# Patient Record
Sex: Male | Born: 1995 | Hispanic: Yes | Marital: Single | Smoking: Current some day smoker
Health system: Southern US, Community
[De-identification: ages and names within clinical notes are randomized; demographics above are authoritative.]

## PROBLEM LIST (undated history)

## (undated) DIAGNOSIS — F32A Depression, unspecified: Secondary | ICD-10-CM

## (undated) DIAGNOSIS — F419 Anxiety disorder, unspecified: Secondary | ICD-10-CM

## (undated) DIAGNOSIS — F329 Major depressive disorder, single episode, unspecified: Secondary | ICD-10-CM

## (undated) HISTORY — PX: STRABISMUS SURGERY: SHX218

---
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
## 2017-05-21 ENCOUNTER — Encounter: Payer: Self-pay | Admitting: *Deleted

## 2017-05-21 ENCOUNTER — Emergency Department: Payer: BLUE CROSS/BLUE SHIELD

## 2017-05-21 ENCOUNTER — Observation Stay: Payer: BLUE CROSS/BLUE SHIELD | Admitting: Anesthesiology

## 2017-05-21 ENCOUNTER — Encounter
Admission: EM | Disposition: A | Discharge status: Home / Self Care | Payer: Self-pay | Source: Home / Self Care | Attending: General Surgery

## 2017-05-21 ENCOUNTER — Inpatient Hospital Stay
Admission: EM | Admit: 2017-05-21 | Discharge: 2017-05-23 | DRG: 341 | Disposition: A | Discharge status: Home / Self Care | Payer: BLUE CROSS/BLUE SHIELD | Attending: General Surgery | Admitting: General Surgery

## 2017-05-21 ENCOUNTER — Other Ambulatory Visit: Payer: Self-pay

## 2017-05-21 ENCOUNTER — Observation Stay: Payer: BLUE CROSS/BLUE SHIELD

## 2017-05-21 DIAGNOSIS — R0902 Hypoxemia: Secondary | ICD-10-CM

## 2017-05-21 DIAGNOSIS — G912 (Idiopathic) normal pressure hydrocephalus: Secondary | ICD-10-CM

## 2017-05-21 DIAGNOSIS — J810 Acute pulmonary edema: Secondary | ICD-10-CM | POA: Diagnosis not present

## 2017-05-21 DIAGNOSIS — F419 Anxiety disorder, unspecified: Secondary | ICD-10-CM | POA: Diagnosis present

## 2017-05-21 DIAGNOSIS — F329 Major depressive disorder, single episode, unspecified: Secondary | ICD-10-CM | POA: Diagnosis present

## 2017-05-21 DIAGNOSIS — Z79899 Other long term (current) drug therapy: Secondary | ICD-10-CM

## 2017-05-21 DIAGNOSIS — F17210 Nicotine dependence, cigarettes, uncomplicated: Secondary | ICD-10-CM | POA: Diagnosis present

## 2017-05-21 DIAGNOSIS — R112 Nausea with vomiting, unspecified: Secondary | ICD-10-CM

## 2017-05-21 DIAGNOSIS — K3580 Unspecified acute appendicitis: Secondary | ICD-10-CM | POA: Diagnosis present

## 2017-05-21 DIAGNOSIS — K3589 Other acute appendicitis without perforation or gangrene: Secondary | ICD-10-CM | POA: Diagnosis present

## 2017-05-21 DIAGNOSIS — K37 Unspecified appendicitis: Secondary | ICD-10-CM | POA: Diagnosis present

## 2017-05-21 HISTORY — PX: LAPAROSCOPIC APPENDECTOMY: SHX408

## 2017-05-21 LAB — URINALYSIS, COMPLETE (UACMP) WITH MICROSCOPIC
BILIRUBIN URINE: NEGATIVE
GLUCOSE, UA: NEGATIVE mg/dL
HGB URINE DIPSTICK: NEGATIVE
KETONES UR: 80 mg/dL — AB
LEUKOCYTES UA: NEGATIVE
NITRITE: NEGATIVE
PH: 8 (ref 5.0–8.0)
Protein, ur: 30 mg/dL — AB
RBC / HPF: NONE SEEN RBC/hpf (ref 0–5)
SPECIFIC GRAVITY, URINE: 1.023 (ref 1.005–1.030)
Squamous Epithelial / LPF: NONE SEEN

## 2017-05-21 LAB — COMPREHENSIVE METABOLIC PANEL
ALK PHOS: 88 U/L (ref 38–126)
ALT: 21 U/L (ref 17–63)
ANION GAP: 13 (ref 5–15)
AST: 35 U/L (ref 15–41)
Albumin: 4.7 g/dL (ref 3.5–5.0)
BUN: 12 mg/dL (ref 6–20)
CALCIUM: 9.3 mg/dL (ref 8.9–10.3)
CO2: 20 mmol/L — AB (ref 22–32)
CREATININE: 0.67 mg/dL (ref 0.61–1.24)
Chloride: 105 mmol/L (ref 101–111)
GFR calc Af Amer: >60 mL/min (ref >60)
Glucose, Bld: 143 mg/dL — ABNORMAL HIGH (ref 65–99)
Potassium: 3.8 mmol/L (ref 3.5–5.1)
SODIUM: 138 mmol/L (ref 135–145)
Total Bilirubin: 1.1 mg/dL (ref 0.3–1.2)
Total Protein: 7.1 g/dL (ref 6.5–8.1)

## 2017-05-21 LAB — LIPASE, BLOOD: LIPASE: 29 U/L (ref 11–51)

## 2017-05-21 LAB — CBC
HCT: 46.2 % (ref 40.0–52.0)
HEMOGLOBIN: 15.3 g/dL (ref 13.0–18.0)
MCH: 27.7 pg (ref 26.0–34.0)
MCHC: 33.2 g/dL (ref 32.0–36.0)
MCV: 83.4 fL (ref 80.0–100.0)
PLATELETS: 268 10*3/uL (ref 150–440)
RBC: 5.54 MIL/uL (ref 4.40–5.90)
RDW: 13.8 % (ref 11.5–14.5)
WBC: 17.8 10*3/uL — AB (ref 3.8–10.6)

## 2017-05-21 LAB — MRSA PCR SCREENING: MRSA BY PCR: NEGATIVE

## 2017-05-21 SURGERY — APPENDECTOMY, LAPAROSCOPIC
Anesthesia: General

## 2017-05-21 MED ORDER — KETOROLAC TROMETHAMINE 30 MG/ML IJ SOLN
30.0000 mg | Freq: Four times a day (QID) | INTRAMUSCULAR | Status: DC
  Administered 2017-05-21 – 2017-05-22 (×7): 30 mg via INTRAVENOUS
  Filled 2017-05-21 (×7): qty 1

## 2017-05-21 MED ORDER — LIDOCAINE HCL (PF) 2 % IJ SOLN
INTRAMUSCULAR | Status: AC
  Filled 2017-05-21: qty 10

## 2017-05-21 MED ORDER — FENTANYL CITRATE (PF) 100 MCG/2ML IJ SOLN
INTRAMUSCULAR | Status: AC
  Filled 2017-05-21: qty 2

## 2017-05-21 MED ORDER — ONDANSETRON HCL 4 MG/2ML IJ SOLN
4.0000 mg | Freq: Four times a day (QID) | INTRAMUSCULAR | Status: DC | PRN
  Administered 2017-05-21: 4 mg via INTRAVENOUS

## 2017-05-21 MED ORDER — SUCCINYLCHOLINE CHLORIDE 20 MG/ML IJ SOLN
INTRAMUSCULAR | Status: DC | PRN
  Administered 2017-05-21: 100 mg via INTRAVENOUS

## 2017-05-21 MED ORDER — ONDANSETRON HCL 4 MG/2ML IJ SOLN
INTRAMUSCULAR | Status: AC
  Filled 2017-05-21: qty 2

## 2017-05-21 MED ORDER — SODIUM CHLORIDE 0.9 % IV SOLN: 1.0000 g | Freq: Once | INTRAVENOUS | Status: DC

## 2017-05-21 MED ORDER — FUROSEMIDE 10 MG/ML IJ SOLN
INTRAMUSCULAR | Status: AC
  Filled 2017-05-21: qty 4

## 2017-05-21 MED ORDER — KETAMINE HCL 50 MG/ML IJ SOLN
INTRAMUSCULAR | Status: AC
  Filled 2017-05-21: qty 10

## 2017-05-21 MED ORDER — SODIUM CHLORIDE 0.9 % IV SOLN
INTRAVENOUS | Status: AC
  Filled 2017-05-21: qty 10

## 2017-05-21 MED ORDER — SUCCINYLCHOLINE CHLORIDE 20 MG/ML IJ SOLN
INTRAMUSCULAR | Status: AC
  Filled 2017-05-21: qty 1

## 2017-05-21 MED ORDER — PIPERACILLIN-TAZOBACTAM 3.375 G IVPB
3.3750 g | Freq: Three times a day (TID) | INTRAVENOUS | Status: AC
  Administered 2017-05-21 – 2017-05-22 (×5): 3.375 g via INTRAVENOUS
  Filled 2017-05-21 (×7): qty 50

## 2017-05-21 MED ORDER — ACETAMINOPHEN 10 MG/ML IV SOLN
INTRAVENOUS | Status: DC | PRN
  Administered 2017-05-21: 1000 mg via INTRAVENOUS

## 2017-05-21 MED ORDER — HYDROMORPHONE HCL 1 MG/ML IJ SOLN: 0.5000 mg | INTRAMUSCULAR | Status: DC | PRN

## 2017-05-21 MED ORDER — FUROSEMIDE 10 MG/ML IJ SOLN
INTRAMUSCULAR | Status: AC
  Filled 2017-05-21: qty 2

## 2017-05-21 MED ORDER — ONDANSETRON 4 MG PO TBDP
4.0000 mg | ORAL_TABLET | Freq: Four times a day (QID) | ORAL | Status: DC | PRN
  Filled 2017-05-21: qty 1

## 2017-05-21 MED ORDER — MIDAZOLAM HCL 2 MG/2ML IJ SOLN
INTRAMUSCULAR | Status: AC
  Filled 2017-05-21: qty 2

## 2017-05-21 MED ORDER — PANTOPRAZOLE SODIUM 40 MG IV SOLR
40.0000 mg | Freq: Every day | INTRAVENOUS | Status: DC
  Administered 2017-05-21 – 2017-05-22 (×2): 40 mg via INTRAVENOUS
  Filled 2017-05-21 (×2): qty 40

## 2017-05-21 MED ORDER — IPRATROPIUM-ALBUTEROL 0.5-2.5 (3) MG/3ML IN SOLN
RESPIRATORY_TRACT | Status: AC
  Filled 2017-05-21: qty 3

## 2017-05-21 MED ORDER — DEXAMETHASONE SODIUM PHOSPHATE 10 MG/ML IJ SOLN
INTRAMUSCULAR | Status: DC | PRN
  Administered 2017-05-21: 10 mg via INTRAVENOUS

## 2017-05-21 MED ORDER — ONDANSETRON HCL 4 MG/2ML IJ SOLN: 4.0000 mg | Freq: Once | INTRAMUSCULAR | Status: DC | PRN

## 2017-05-21 MED ORDER — BUPIVACAINE HCL (PF) 0.25 % IJ SOLN
INTRAMUSCULAR | Status: AC
  Filled 2017-05-21: qty 30

## 2017-05-21 MED ORDER — MIDAZOLAM HCL 2 MG/2ML IJ SOLN
INTRAMUSCULAR | Status: DC | PRN
  Administered 2017-05-21: 2 mg via INTRAVENOUS

## 2017-05-21 MED ORDER — FUROSEMIDE 10 MG/ML IJ SOLN
40.0000 mg | Freq: Once | INTRAMUSCULAR | Status: AC
  Administered 2017-05-21: 40 mg via INTRAVENOUS

## 2017-05-21 MED ORDER — FUROSEMIDE 10 MG/ML IJ SOLN
20.0000 mg | Freq: Once | INTRAMUSCULAR | Status: AC
  Administered 2017-05-21: 20 mg via INTRAVENOUS

## 2017-05-21 MED ORDER — SUGAMMADEX SODIUM 200 MG/2ML IV SOLN
INTRAVENOUS | Status: AC
  Filled 2017-05-21: qty 2

## 2017-05-21 MED ORDER — NALOXONE HCL 0.4 MG/ML IJ SOLN
INTRAMUSCULAR | Status: DC | PRN
  Administered 2017-05-21: 40 ug via INTRAVENOUS

## 2017-05-21 MED ORDER — LIDOCAINE HCL (PF) 1 % IJ SOLN
INTRAMUSCULAR | Status: AC
  Filled 2017-05-21: qty 30

## 2017-05-21 MED ORDER — ENOXAPARIN SODIUM 40 MG/0.4ML ~~LOC~~ SOLN
40.0000 mg | SUBCUTANEOUS | Status: DC
  Administered 2017-05-21: 40 mg via SUBCUTANEOUS
  Filled 2017-05-21: qty 0.4

## 2017-05-21 MED ORDER — IOPAMIDOL (ISOVUE-300) INJECTION 61%
100.0000 mL | Freq: Once | INTRAVENOUS | Status: AC | PRN
  Administered 2017-05-21: 100 mL via INTRAVENOUS

## 2017-05-21 MED ORDER — ROCURONIUM BROMIDE 100 MG/10ML IV SOLN
INTRAVENOUS | Status: DC | PRN
  Administered 2017-05-21: 10 mg via INTRAVENOUS
  Administered 2017-05-21: 40 mg via INTRAVENOUS

## 2017-05-21 MED ORDER — FENTANYL CITRATE (PF) 100 MCG/2ML IJ SOLN
INTRAMUSCULAR | Status: DC | PRN
  Administered 2017-05-21: 50 ug via INTRAVENOUS
  Administered 2017-05-21: 100 ug via INTRAVENOUS
  Administered 2017-05-21: 50 ug via INTRAVENOUS

## 2017-05-21 MED ORDER — BUPIVACAINE-EPINEPHRINE (PF) 0.25% -1:200000 IJ SOLN
INTRAMUSCULAR | Status: DC | PRN
  Administered 2017-05-21: 8.5 mL

## 2017-05-21 MED ORDER — EPINEPHRINE PF 1 MG/ML IJ SOLN
INTRAMUSCULAR | Status: AC
  Filled 2017-05-21: qty 1

## 2017-05-21 MED ORDER — LACTATED RINGERS IV SOLN
125.0000 mL/h | INTRAVENOUS | Status: DC
  Administered 2017-05-21 (×2): via INTRAVENOUS
  Administered 2017-05-21: 125 mL/h via INTRAVENOUS

## 2017-05-21 MED ORDER — PROPOFOL 10 MG/ML IV BOLUS
INTRAVENOUS | Status: DC | PRN
  Administered 2017-05-21: 160 mg via INTRAVENOUS

## 2017-05-21 MED ORDER — DEXAMETHASONE SODIUM PHOSPHATE 10 MG/ML IJ SOLN
INTRAMUSCULAR | Status: AC
  Filled 2017-05-21: qty 1

## 2017-05-21 MED ORDER — LIDOCAINE HCL (CARDIAC) 20 MG/ML IV SOLN
INTRAVENOUS | Status: DC | PRN
  Administered 2017-05-21: 100 mg via INTRAVENOUS

## 2017-05-21 MED ORDER — ROCURONIUM BROMIDE 50 MG/5ML IV SOLN
INTRAVENOUS | Status: AC
  Filled 2017-05-21: qty 1

## 2017-05-21 MED ORDER — LIDOCAINE HCL 1 % IJ SOLN
INTRAMUSCULAR | Status: DC | PRN
  Administered 2017-05-21: 8.5 mL

## 2017-05-21 MED ORDER — SUGAMMADEX SODIUM 200 MG/2ML IV SOLN
INTRAVENOUS | Status: DC | PRN
  Administered 2017-05-21: 150 mg via INTRAVENOUS

## 2017-05-21 MED ORDER — ACETAMINOPHEN 10 MG/ML IV SOLN
INTRAVENOUS | Status: AC
  Filled 2017-05-21: qty 100

## 2017-05-21 MED ORDER — FENTANYL CITRATE (PF) 100 MCG/2ML IJ SOLN: 25.0000 ug | INTRAMUSCULAR | Status: DC | PRN

## 2017-05-21 MED ORDER — HYDROCODONE-ACETAMINOPHEN 5-325 MG PO TABS
1.0000 | ORAL_TABLET | Freq: Four times a day (QID) | ORAL | Status: DC | PRN
  Administered 2017-05-22: 1 via ORAL
  Filled 2017-05-21: qty 1

## 2017-05-21 SURGICAL SUPPLY — 44 items
ADHESIVE MASTISOL STRL (MISCELLANEOUS) ×3 IMPLANT
APPLIER CLIP 5 13 M/L LIGAMAX5 (MISCELLANEOUS)
BLADE SURG SZ11 CARB STEEL (BLADE) ×3 IMPLANT
BULB RESERV EVAC DRAIN JP 100C (MISCELLANEOUS) IMPLANT
CANISTER SUCT 1200ML W/VALVE (MISCELLANEOUS) ×3 IMPLANT
CATH COUDE FOLEY 5CC 14FR (CATHETERS) ×6 IMPLANT
CATH FOLEY SIL 2WAY 12FR5CC (CATHETERS) ×3 IMPLANT
CHLORAPREP W/TINT 26ML (MISCELLANEOUS) ×6 IMPLANT
CLIP APPLIE 5 13 M/L LIGAMAX5 (MISCELLANEOUS) IMPLANT
CLOSURE WOUND 1/2 X4 (GAUZE/BANDAGES/DRESSINGS) ×1
CUTTER FLEX LINEAR 45M (STAPLE) ×3 IMPLANT
DRAIN CHANNEL JP 19F (MISCELLANEOUS) IMPLANT
DRSG TEGADERM 2-3/8X2-3/4 SM (GAUZE/BANDAGES/DRESSINGS) ×9 IMPLANT
DRSG TELFA 4X3 1S NADH ST (GAUZE/BANDAGES/DRESSINGS) ×3 IMPLANT
ELECT REM PT RETURN 9FT ADLT (ELECTROSURGICAL) ×3
ELECTRODE REM PT RTRN 9FT ADLT (ELECTROSURGICAL) ×1 IMPLANT
GLOVE BIO SURGEON STRL SZ7.5 (GLOVE) ×18 IMPLANT
GLOVE INDICATOR 8.0 STRL GRN (GLOVE) ×3 IMPLANT
GOWN STRL REUS W/ TWL LRG LVL3 (GOWN DISPOSABLE) ×2 IMPLANT
GOWN STRL REUS W/TWL LRG LVL3 (GOWN DISPOSABLE) ×4
IRRIGATION STRYKERFLOW (MISCELLANEOUS) IMPLANT
IRRIGATOR STRYKERFLOW (MISCELLANEOUS)
IV NS 1000ML (IV SOLUTION) ×2
IV NS 1000ML BAXH (IV SOLUTION) ×1 IMPLANT
KIT TURNOVER KIT A (KITS) ×3 IMPLANT
LABEL OR SOLS (LABEL) ×3 IMPLANT
NEEDLE HYPO 25X1 1.5 SAFETY (NEEDLE) ×3 IMPLANT
NEEDLE VERESS 14GA 120MM (NEEDLE) ×3 IMPLANT
NS IRRIG 500ML POUR BTL (IV SOLUTION) ×3 IMPLANT
PACK LAP CHOLECYSTECTOMY (MISCELLANEOUS) ×3 IMPLANT
POUCH SPECIMEN RETRIEVAL 10MM (ENDOMECHANICALS) ×3 IMPLANT
RELOAD 45 VASCULAR/THIN (ENDOMECHANICALS) ×3 IMPLANT
RELOAD STAPLE TA45 3.5 REG BLU (ENDOMECHANICALS) ×3 IMPLANT
SCALPEL HARMONIC ACE (MISCELLANEOUS) ×3 IMPLANT
SLEEVE ENDOPATH XCEL 5M (ENDOMECHANICALS) ×3 IMPLANT
STRIP CLOSURE SKIN 1/2X4 (GAUZE/BANDAGES/DRESSINGS) ×2 IMPLANT
SUT MNCRL 4-0 (SUTURE) ×2
SUT MNCRL 4-0 27XMFL (SUTURE) ×1
SUT VICRYL 0 UR6 27IN ABS (SUTURE) IMPLANT
SUTURE MNCRL 4-0 27XMF (SUTURE) ×1 IMPLANT
TRAY FOLEY W/METER SILVER 16FR (SET/KITS/TRAYS/PACK) ×3 IMPLANT
TROCAR XCEL 12X100 BLDLESS (ENDOMECHANICALS) ×3 IMPLANT
TROCAR XCEL NON-BLD 5MMX100MML (ENDOMECHANICALS) ×3 IMPLANT
TUBING INSUFFLATION (TUBING) ×3 IMPLANT

## 2017-05-21 NOTE — ED Triage Notes (Signed)
Patient brought in be ems. Patient reported eating dinner around 17:00 tonight around 19:00. Patient reports vomiting times 5-6 times and lower abdominal pain.Per ems vital signs stable. Patient given 4 mg IV Zofran by ems.

## 2017-05-21 NOTE — Transfer of Care (Signed)
Immediate Anesthesia Transfer of Care Note  Patient: Bryan Roberson  Procedure(s) Performed: APPENDECTOMY LAPAROSCOPIC (N/A )  Patient Location: PACU  Anesthesia Type:General  Level of Consciousness: awake, alert  and oriented  Airway & Oxygen Therapy: Patient Spontanous Breathing and Patient connected to face mask oxygen  Post-op Assessment: Report given to RN and Post -op Vital signs reviewed and stable  Post vital signs: Reviewed and stable  Last Vitals:  Vitals:   05/21/17 1236 05/21/17 1522  BP: (!) 101/43 (!) 124/52  Pulse: 66 97  Resp:  15  Temp: 36.5 C 36.6 C  SpO2: 99% (!) 58%    Last Pain:  Vitals:   05/21/17 1236  TempSrc: Oral  PainSc:       Patients Stated Pain Goal: 0 (05/21/17 1116)  Complications: No apparent anesthesia complications

## 2017-05-21 NOTE — Op Note (Signed)
laparascopic appendectomy   Bryan Roberson Date of operation:  05/21/2017  Indications: The patient presented with a history of  abdominal pain. Workup has revealed findings consistent with acute appendicitis.  Pre-operative Diagnosis: Acute appendicitis without mention of peritonitis  Post-operative Diagnosis: Same  Surgeon: Leonette Mostharles T. Tonita CongWoodham, MD, FACS  Anesthesia: General with endotracheal tube  Procedure Details  The patient was seen again in the preop area. The options of surgery versus observation were reviewed with the patient and/or family. The risks of bleeding, infection, recurrence of symptoms, negative laparoscopy, potential for an open procedure, bowel injury, abscess or infection, were all reviewed as well. The patient was taken to Operating Room, identified as Bryan Roberson and the procedure verified as laparoscopic appendectomy. A Time Out was held and the above information confirmed.  The patient was placed in the supine position and general anesthesia was induced.  Antibiotic prophylaxis was administered and VT E prophylaxis was in place. A Foley catheter was attempted to be placed by the nursing staff. After numerous failed attempts, I attempted to place a coudet catheter also without success. The decision was made to proceed without a catheter.  The abdomen was prepped and draped in a sterile fashion. An infraumbilical incision was made. A Veress needle was placed and pneumoperitoneum was obtained. A 5 mm trocar port was placed without difficulty and the abdominal cavity was explored.  Under direct vision a 5 mm right lower quadrant port was placed and a 12 mm left lateral port was placed all under direct vision.  The appendix was identified and found to be acutely inflamed in the pelvic position but without evidence of rupture.   The appendix was carefully dissected. The base of the appendix was dissected out and divided with a standard load Endo GIA. The mesoappendix  was use a harmonic scalpel.  The appendix was passed out through the left lateral port site with the aid of an Endo Catch bag. The right lower quadrant and pelvis was then visualized and there was minimal fluid within the pelvis that was removed using a suction device. Inspection  failed to identify any additional bleeding and there were no signs of bowel injury.     Again the right lower quadrant was inspected there was no sign of bleeding or bowel injury therefore pneumoperitoneum was released, all ports were removed and the fascia of the Left lower quadrant trocar was closed with a figure of eight O vicryl and the skin incisions were approximated with subcuticular 4-0 Monocryl. Steri-Strips and Mastisol and sterile dressings were placed.  The patient tolerated the procedure well, there were no complications. The sponge lap and needle count were correct at the end of the procedure.  The patient was taken to the recovery room in stable condition to be admitted for continued care.  Findings: acute appendicitis  Estimated Blood Loss: 10 mL                  Specimens: appendix         Complications:  Inability to place a foley                  Bryan Frameharles Jujuan Dugo MD, FACS

## 2017-05-21 NOTE — ED Provider Notes (Signed)
[facility] Emergency Department Provider Note   ____________________________________________   First MD Initiated Contact with Patient 05/21/17 [phone]     (approximate)  I have reviewed the triage vital signs and the nursing notes.   HISTORY  Chief Complaint Emesis    HPI Bryan Roberson is a 22 y.o. male who comes into the hospital today with some nausea and vomiting.  He reports that he could not keep anything down.  He reports that by the time he got home he was in pain in his abdomen that was a 8 out of 10 in intensity.  The patient reports that it was in his lower abdomen.  His friends called EMS and brought him in.  The patient states that the pain started around 7 PM.  He vomited over 30 times.  He states that the pain is gone now.  He had a cold sweat but no fevers and no diarrhea.  He reports that he had eaten qdoba approximately 1 hour before the start of his symptoms the patient reports that he had woken up with some sharp pain in his lower abdomen.  He is here today for evaluation.  Past Medical History:  Diagnosis Date  . Anxiety   . Depression     Patient Active Problem List   Diagnosis Date Noted  . Acute appendicitis 05/21/2017  . Severe recurrent major depression without psychotic features (HCC) 12/21/2015  . Suicidal ideation 12/21/2015  . 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 ADHD (attention deficit hyperactivity disorder) 12/21/2015    Past Surgical History:  Procedure Laterality Date  . STRABISMUS SURGERY      Prior to Admission medications   Medication Sig Start Date End Date Taking? Authorizing Provider  doxycycline (VIBRAMYCIN) 100 MG capsule Take 1 capsule by mouth daily. 05/08/17  Yes [provider]  FLUoxetine (PROZAC) 20 MG capsule Take 20 mg by mouth daily. 05/08/17  Yes [provider]  hydrOXYzine (ATARAX/VISTARIL) 50 MG tablet Take 50 mg by mouth at bedtime as needed. 05/09/17  Yes [provider]  methylphenidate (RITALIN LA) 30 MG 24 hr capsule Take 1 capsule by mouth every morning. 05/09/17  Yes [provider]    Allergies Patient has no known allergies.  History reviewed. No pertinent family history.  Social History Social History   Tobacco Use  . Smoking status: Current Some Day Smoker    Types: Cigarettes  . Smokeless tobacco: Never Used  Substance Use Topics  . Alcohol use: Yes    Alcohol/week: 3.0 oz    Types: 5 Shots of liquor per week  . Drug use: Yes    Types: Marijuana    Comment: daily use    Review of Systems  Constitutional: No fever/chills Eyes: No visual changes. ENT: No sore throat. Cardiovascular: Denies chest pain. Respiratory: Denies shortness of breath. Gastrointestinal: abdominal pain.  nausea, vomiting.  No diarrhea.  No constipation. Genitourinary: Negative for dysuria. Musculoskeletal: Negative for back pain. Skin: Negative for rash. Neurological: Negative for headaches, focal weakness or numbness.   ____________________________________________   PHYSICAL EXAM:  VITAL SIGNS: ED Triage Vitals  Enc Vitals Group     BP 05/21/17 0035 136/86     Pulse Rate 05/21/17 0035 72     Resp 05/21/17 0035 20     Temp 05/21/17 0035 98.4 F (36.9 C)     Temp Source 05/21/17 0035 Oral     SpO2 05/21/17 0035 99 %     Weight 05/21/17  0041 145 lb (65.8 kg)     Height 05/21/17 0041 5\' 6"  (1.676 m)     Head Circumference --      Peak Flow --      Pain Score 05/21/17 0040 8     Pain Loc --      Pain Edu? --      Excl. in GC? --     Constitutional: Alert and oriented. Well appearing and in mild distress. Eyes: Conjunctivae are normal. PERRL. EOMI. Head: Atraumatic. Nose: No congestion/rhinnorhea. Mouth/Throat: Mucous membranes are moist.  Oropharynx non-erythematous. Cardiovascular: Normal rate, regular rhythm. Grossly normal heart sounds.  Good peripheral circulation. Respiratory: Normal respiratory effort.  No  retractions. Lungs CTAB. Gastrointestinal: Soft with some mild right lower quadrant abdominal pain to palpation. No distention.  Positive bowel sounds Musculoskeletal: No lower extremity tenderness nor edema.   Neurologic:  Normal speech and language.  Skin:  Skin is warm, dry and intact.  Psychiatric: Mood and affect are normal.   ____________________________________________   LABS (all labs ordered are listed, but only abnormal results are displayed)  Labs Reviewed  COMPREHENSIVE METABOLIC PANEL - Abnormal; Notable for the following components:      Result Value   CO2 20 (*)    Glucose, Bld 143 (*)    All other components within normal limits  CBC - Abnormal; Notable for the following components:   WBC 17.8 (*)    All other components within normal limits  URINALYSIS, COMPLETE (UACMP) WITH MICROSCOPIC - Abnormal; Notable for the following components:   Color, Urine YELLOW (*)    APPearance TURBID (*)    Ketones, ur 80 (*)    Protein, ur 30 (*)    Bacteria, UA RARE (*)    All other components within normal limits  MRSA PCR SCREENING  LIPASE, BLOOD  HIV ANTIBODY (ROUTINE TESTING)   ____________________________________________  EKG  none ____________________________________________  RADIOLOGY  ED MD interpretation:  CT abd and pelvis: acute appendicitis  Official radiology report(s): Ct Abdomen Pelvis W Contrast  Result Date: 05/21/2017 CLINICAL DATA:  Vomiting after dinner tonight. Lower abdominal pain. EXAM: CT ABDOMEN AND PELVIS WITH CONTRAST TECHNIQUE: Multidetector CT imaging of the abdomen and pelvis was performed using the standard protocol following bolus administration of intravenous contrast. CONTRAST:  100mL ISOVUE-300 IOPAMIDOL (ISOVUE-300) INJECTION 61% COMPARISON:  None. FINDINGS: LOWER CHEST: Trace tree-in-bud infiltrates lingula, 3 mm sub solid pulmonary nodule LEFT lower lobe, no routine indicated follow-up by consensus criteria. Included heart size is  normal. No pericardial effusion. HEPATOBILIARY: Liver and gallbladder are normal. PANCREAS: Normal. SPLEEN: Normal. ADRENALS/URINARY TRACT: Kidneys are orthotopic, demonstrating symmetric enhancement. No nephrolithiasis, hydronephrosis or solid renal masses. The unopacified ureters are normal in course and caliber. Urinary bladder is partially distended with mild disproportionate bladder wall thickening. Normal adrenal glands. STOMACH/BOWEL: The stomach, small and large bowel are normal in course and caliber without inflammatory changes, limited without oral contrast. Appendix: Location: RIGHT lower quadrant to  central pelvis. Diameter: 11 mm Appendicolith: No Mucosal hyper-enhancement: Present with mural thickening. Extraluminal gas: Not present Periappendiceal collection: Not present VASCULAR/LYMPHATIC: Aortoiliac vessels are normal in course and caliber. No lymphadenopathy by CT size criteria. REPRODUCTIVE: Normal. OTHER: Small volume free fluid in the pelvis. No intraperitoneal free air or focal fluid collection. MUSCULOSKELETAL: Nonacute. IMPRESSION: 1. Acute uncomplicated appendicitis. 2. Mild bladder wall thickening concerning for cystitis. 3. Small volume free fluid, likely reactive. Acute findings discussed with and reconfirmed by Dr.Jaziel Bennett on 05/21/2017 at 5:40 am. Electronically  Signed   By: Awilda Metro M.D.   On: 05/21/2017 05:43    ____________________________________________   PROCEDURES  Procedure(s) performed: None  Procedures  Critical Care performed: No  ____________________________________________   INITIAL IMPRESSION / ASSESSMENT AND PLAN / ED COURSE  As part of my medical decision making, I reviewed the following data within the electronic MEDICAL RECORD NUMBER Notes from prior ED visits and [place] Controlled Substance Database   This is a 22 year old male who comes into the hospital today with some nausea and vomiting with some lower abdominal pain.  My  differential diagnosis includes gastroenteritis, colitis, appendicitis.  Patient had some blood work drawn and it showed a white blood cell count of 17.8.  His urine was unremarkable and his CMP was negative.  I sent the patient for CT scan of his abdomen and pelvis and it did show acute appendicitis.  I did contact surgery who did come down to see the patient.  I initially ordered some ceftriaxone but the surgeon changed to Zosyn.  The patient will be admitted to the surgical service.      ____________________________________________   FINAL CLINICAL IMPRESSION(S) / ED DIAGNOSES  Final diagnoses:  Non-intractable vomiting with nausea, unspecified vomiting type  Other acute appendicitis     ED Discharge Orders    None       Note:  This document was prepared using Dragon voice recognition software and may include unintentional dictation errors.    Rebecka Apley, MD 05/21/17 [phone]

## 2017-05-21 NOTE — Progress Notes (Signed)
Dr. Noralyn Pickarroll at bedside, Nursing Supervisor also at bedside, Patient remains alert, oxygen runs between 76 and 84 on 4 liters via nasal cannula.  Going to attempt to try bi-pap For a short time.

## 2017-05-21 NOTE — ED Notes (Signed)
[employer] rep Clayburn Pertvan [phone] will come pick pt upon DC.

## 2017-05-21 NOTE — Anesthesia Post-op Follow-up Note (Signed)
Anesthesia QCDR form completed.        

## 2017-05-21 NOTE — Anesthesia Preprocedure Evaluation (Signed)
Anesthesia Evaluation  Patient identified by MRN, date of birth, ID band Patient awake    Reviewed: Allergy & Precautions, NPO status , Patient's Chart, lab work & pertinent test results  Airway Mallampati: II  TM Distance: >3 FB     Dental  (+) Teeth Intact   Pulmonary Current Smoker,    Pulmonary exam normal        Cardiovascular negative cardio ROS Normal cardiovascular exam     Neuro/Psych PSYCHIATRIC DISORDERS Anxiety Depression negative neurological ROS     GI/Hepatic Neg liver ROS, (+)     substance abuse  , Acute appendix   Endo/Other  negative endocrine ROS  Renal/GU negative Renal ROS  negative genitourinary   Musculoskeletal negative musculoskeletal ROS (+)   Abdominal (+)  Abdomen: tender.    Peds negative pediatric ROS (+)  Hematology negative hematology ROS (+)   Anesthesia Other Findings   Reproductive/Obstetrics                             Anesthesia Physical Anesthesia Plan  ASA: II and emergent  Anesthesia Plan: General   Post-op Pain Management:    Induction: Intravenous, Rapid sequence and Cricoid pressure planned  PONV Risk Score and Plan:   Airway Management Planned: Oral ETT  Additional Equipment:   Intra-op Plan:   Post-operative Plan: Extubation in OR  Informed Consent: I have reviewed the patients History and Physical, chart, labs and discussed the procedure including the risks, benefits and alternatives for the proposed anesthesia with the patient or authorized representative who has indicated his/her understanding and acceptance.   Dental advisory given  Plan Discussed with: CRNA and Surgeon  Anesthesia Plan Comments:         Anesthesia Quick Evaluation

## 2017-05-21 NOTE — Progress Notes (Signed)
Dr. Tonita CongWoodham in PACU, to write orders for ICU admit.

## 2017-05-21 NOTE — Progress Notes (Signed)
Patient given duo neb treatment.

## 2017-05-21 NOTE — Progress Notes (Signed)
Pt's belongings taken to ICU.

## 2017-05-21 NOTE — Progress Notes (Signed)
Report given to Stephenie AcresBarbara Allen RN.

## 2017-05-21 NOTE — ED Triage Notes (Signed)
Pt presents w/ n/v x 4-5 hrs. 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

## 2017-05-21 NOTE — Progress Notes (Addendum)
Rec'd from PACU s/p appendectomy.  Three puncture sites lower abdomen covered with small tegaderm. Awake and alert, on bipap. Sats 100%.  R. T at bedside, switched over to [place] 6L and weaned to 4L.  Pt denies any sob or pain.  Using urinal, cont diuresing.

## 2017-05-21 NOTE — H&P (Signed)
Date of Admission:  05/21/2017  Reason for Admission:  Acute appendicitis  History of Present Illness: Bryan Roberson is a 22 y.o. male who presents with a one day history of abdominal pain, nausea, and vomiting.  He reports the pain started around 7-8 pm after eating Qdoba.  His friends called EMS to bring him over.  He had episodes of emesis prior to that, non-bloody.  He also felt chills and cold-sweats.  The pain is in the right lower quadrant, without radiation.  In the ED, his work-up revealed a WBC of 17.8 and a CT scan which was consistent with acute appendicitis.  Past Medical History: Past Medical History:  Diagnosis Date  . Anxiety   . Depression      Past Surgical History: Past Surgical History:  Procedure Laterality Date  . STRABISMUS SURGERY      Home Medications: Prior to Admission medications   Medication Sig Start Date End Date Taking? Authorizing Provider  doxycycline (VIBRAMYCIN) 100 MG capsule Take 1 capsule by mouth daily. 05/08/17  Yes [provider]  FLUoxetine (PROZAC) 20 MG capsule Take 20 mg by mouth daily. 05/08/17  Yes [provider]  hydrOXYzine (ATARAX/VISTARIL) 50 MG tablet Take 50 mg by mouth at bedtime as needed. 05/09/17  Yes [provider]  methylphenidate (RITALIN LA) 30 MG 24 hr capsule Take 1 capsule by mouth every morning. 05/09/17  Yes [provider]    Allergies: No Known Allergies  Social History:  reports that he has been smoking cigarettes.  he has never used smokeless tobacco. He reports that he uses drugs. Drug: Marijuana. He reports that he does not drink alcohol.   Family History: History of hypertension on both sides of family and diabetes on paternal side.  Review of Systems: Review of Systems  Constitutional: Positive for chills. Negative for fever.  HENT: Negative for hearing loss.   Respiratory: Negative for shortness of breath.   Cardiovascular: Negative for chest pain.   Gastrointestinal: Positive for abdominal pain, nausea and vomiting. Negative for blood in stool, constipation and diarrhea.  Genitourinary: Negative for dysuria.  Musculoskeletal: Negative for myalgias.  Skin: Negative for rash.  Neurological: Negative for dizziness.  Psychiatric/Behavioral: Negative for depression.  All other systems reviewed and are negative.   Physical Exam BP 98/80   Pulse 74   Temp 98.4 F (36.9 C) (Oral)   Resp 18   Ht 5\' 6"  (1.676 m)   Wt 65.8 kg (145 lb)   SpO2 97%   BMI 23.40 kg/m  CONSTITUTIONAL: No acute distress HEENT:  Normocephalic, atraumatic, extraocular motion intact. NECK: Trachea is midline, and there is no jugular venous distension.  RESPIRATORY:  Lungs are clear, and breath sounds are equal bilaterally. Normal respiratory effort without pathologic use of accessory muscles. CARDIOVASCULAR: Heart is regular without murmurs, gallops, or rubs. GI: The abdomen is soft, non-distended, with tenderness to palpation over the right lower quadrant at McBurney's point.  No diffuse peritonitis.  MUSCULOSKELETAL:  Normal muscle strength and tone in all four extremities.  No peripheral edema or cyanosis. SKIN: Skin turgor is normal. There are no pathologic skin lesions.  NEUROLOGIC:  Motor and sensation is grossly normal.  Cranial nerves are grossly intact. PSYCH:  Alert and oriented to person, place and time. Affect is normal.  Laboratory Analysis: Results for orders placed or performed during the hospital encounter of 05/21/17 (from the past 24 hour(s))  Lipase, blood     Status: None   Collection Time: 05/21/17  12:47 AM  Result Value Ref Range   Lipase 29 11 - 51 U/L  Comprehensive metabolic panel     Status: Abnormal   Collection Time: 05/21/17 12:47 AM  Result Value Ref Range   Sodium 138 135 - 145 mmol/L   Potassium 3.8 3.5 - 5.1 mmol/L   Chloride 105 101 - 111 mmol/L   CO2 20 (L) 22 - 32 mmol/L   Glucose, Bld 143 (H) 65 - 99 mg/dL   BUN 12  6 - 20 mg/dL   Creatinine, Ser 1.610.67 0.61 - 1.24 mg/dL   Calcium 9.3 8.9 - 09.610.3 mg/dL   Total Protein 7.1 6.5 - 8.1 g/dL   Albumin 4.7 3.5 - 5.0 g/dL   AST 35 15 - 41 U/L   ALT 21 17 - 63 U/L   Alkaline Phosphatase 88 38 - 126 U/L   Total Bilirubin 1.1 0.3 - 1.2 mg/dL   GFR calc non Af Amer >60 >60 mL/min   GFR calc Af Amer >60 >60 mL/min   Anion gap 13 5 - 15  CBC     Status: Abnormal   Collection Time: 05/21/17 12:47 AM  Result Value Ref Range   WBC 17.8 (H) 3.8 - 10.6 K/uL   RBC 5.54 4.40 - 5.90 MIL/uL   Hemoglobin 15.3 13.0 - 18.0 g/dL   HCT 04.546.2 40.940.0 - 81.152.0 %   MCV 83.4 80.0 - 100.0 fL   MCH 27.7 26.0 - 34.0 pg   MCHC 33.2 32.0 - 36.0 g/dL   RDW 91.413.8 78.211.5 - 95.614.5 %   Platelets 268 150 - 440 K/uL  Urinalysis, Complete w Microscopic     Status: Abnormal   Collection Time: 05/21/17 12:47 AM  Result Value Ref Range   Color, Urine YELLOW (A) YELLOW   APPearance TURBID (A) CLEAR   Specific Gravity, Urine 1.023 1.005 - 1.030   pH 8.0 5.0 - 8.0   Glucose, UA NEGATIVE NEGATIVE mg/dL   Hgb urine dipstick NEGATIVE NEGATIVE   Bilirubin Urine NEGATIVE NEGATIVE   Ketones, ur 80 (A) NEGATIVE mg/dL   Protein, ur 30 (A) NEGATIVE mg/dL   Nitrite NEGATIVE NEGATIVE   Leukocytes, UA NEGATIVE NEGATIVE   RBC / HPF NONE SEEN 0 - 5 RBC/hpf   WBC, UA 0-5 0 - 5 WBC/hpf   Bacteria, UA RARE (A) NONE SEEN   Squamous Epithelial / LPF NONE SEEN NONE SEEN   Mucus PRESENT    Amorphous Crystal PRESENT     Imaging: Ct Abdomen Pelvis W Contrast  Result Date: 05/21/2017 CLINICAL DATA:  Vomiting after dinner tonight. Lower abdominal pain. EXAM: CT ABDOMEN AND PELVIS WITH CONTRAST TECHNIQUE: Multidetector CT imaging of the abdomen and pelvis was performed using the standard protocol following bolus administration of intravenous contrast. CONTRAST:  100mL ISOVUE-300 IOPAMIDOL (ISOVUE-300) INJECTION 61% COMPARISON:  None. FINDINGS: LOWER CHEST: Trace tree-in-bud infiltrates lingula, 3 mm sub solid  pulmonary nodule LEFT lower lobe, no routine indicated follow-up by consensus criteria. Included heart size is normal. No pericardial effusion. HEPATOBILIARY: Liver and gallbladder are normal. PANCREAS: Normal. SPLEEN: Normal. ADRENALS/URINARY TRACT: Kidneys are orthotopic, demonstrating symmetric enhancement. No nephrolithiasis, hydronephrosis or solid renal masses. The unopacified ureters are normal in course and caliber. Urinary bladder is partially distended with mild disproportionate bladder wall thickening. Normal adrenal glands. STOMACH/BOWEL: The stomach, small and large bowel are normal in course and caliber without inflammatory changes, limited without oral contrast. Appendix: Location: RIGHT lower quadrant to  central pelvis. Diameter: 11 mm Appendicolith:  No Mucosal hyper-enhancement: Present with mural thickening. Extraluminal gas: Not present Periappendiceal collection: Not present VASCULAR/LYMPHATIC: Aortoiliac vessels are normal in course and caliber. No lymphadenopathy by CT size criteria. REPRODUCTIVE: Normal. OTHER: Small volume free fluid in the pelvis. No intraperitoneal free air or focal fluid collection. MUSCULOSKELETAL: Nonacute. IMPRESSION: 1. Acute uncomplicated appendicitis. 2. Mild bladder wall thickening concerning for cystitis. 3. Small volume free fluid, likely reactive. Acute findings discussed with and reconfirmed by Dr.ALLISON WEBSTER on 05/21/2017 at 5:40 am. Electronically Signed   By: Awilda Metro M.D.   On: 05/21/2017 05:43    Assessment and Plan: This is a 22 y.o. male who presents with acute appendicitis.  I have independently viewed the patient's imaging study and reviewed his laboratory studies.  His WBC is 17.8 and his CT scan does show a dilated appendix with surrounding inflammation.  No evidence of rupture on CT scan.  Discussed with the patient and his mother over the phone that he has acute appendicitis and the standard of care management is appendectomy.  He  would be admitted to the hospital and be made NPO with IV fluid hydration and started on IV antibiotics.  He will have appropriate pain and nausea control.  He would proceed to the OR later today with my partner Dr. Tonita Cong, with plans for a laparoscopic appendectomy.  Discussed with the patient the risk of bleeding, infection, and injury to surrounding structures, and possibility of converting to open procedure.  He's willing to proceed.    Howie Ill, MD [facility]

## 2017-05-21 NOTE — Anesthesia Procedure Notes (Signed)
Procedure Name: Intubation Date/Time: 05/21/2017 2:20 PM Performed by: Nelda Marseille, CRNA Pre-anesthesia Checklist: Patient identified, Patient being monitored, Timeout performed, Emergency Drugs available and Suction available Patient Re-evaluated:Patient Re-evaluated prior to induction Oxygen Delivery Method: Circle system utilized Preoxygenation: Pre-oxygenation with 100% oxygen Induction Type: IV induction Ventilation: Mask ventilation without difficulty Laryngoscope Size: Mac and 3 Grade View: Grade II Tube type: Oral Tube size: 7.0 mm Number of attempts: 1 Airway Equipment and Method: Stylet Placement Confirmation: ETT inserted through vocal cords under direct vision,  positive ETCO2 and breath sounds checked- equal and bilateral Secured at: 21 cm Tube secured with: Tape Dental Injury: Teeth and Oropharynx as per pre-operative assessment

## 2017-05-21 NOTE — Brief Op Note (Signed)
05/21/2017  3:12 PM  PATIENT:  Bryan Roberson  22 y.o. male  PRE-OPERATIVE DIAGNOSIS:  acute appendicitis  POST-OPERATIVE DIAGNOSIS:  same  PROCEDURE:  Procedure(s): APPENDECTOMY LAPAROSCOPIC (N/A)  SURGEON:  Surgeon(s) and Role:    * Ricarda FrameWoodham, Lashonne Shull, MD - Primary  PHYSICIAN ASSISTANT:   ASSISTANTS: none   ANESTHESIA:   general  EBL:  10 mL   BLOOD ADMINISTERED:none  DRAINS: none   LOCAL MEDICATIONS USED:  MARCAINE   , XYLOCAINE  and Amount: 17 ml  SPECIMEN:  Source of Specimen:  appendix  DISPOSITION OF SPECIMEN:  PATHOLOGY  COUNTS:  YES  TOURNIQUET:  * No tourniquets in log *  DICTATION: .Dragon Dictation  PLAN OF CARE: Admit for overnight observation  PATIENT DISPOSITION:  PACU - hemodynamically stable.   Delay start of Pharmacological VTE agent (>24hrs) due to surgical blood loss or risk of bleeding: no

## 2017-05-21 NOTE — Progress Notes (Signed)
Dr. Noralyn Pickarroll at bedside, patient pulling at his oxygen mask And says he feels like he can't breathe.  Repositioned Patient sitting upright, encouraged to take slow deep Breaths, patient anxious.

## 2017-05-21 NOTE — OR Nursing (Signed)
Unable to get urine returned from foley.Did not blow up the balloon. [facility] LPharon MooreRN

## 2017-05-21 NOTE — ED Notes (Signed)
Pt stated that around 9:00pm tonight he felt nauseous, dizzy, and was vomiting.  Pt is alert and oriented x 4.  Pt's N/V has subsided.

## 2017-05-21 NOTE — Progress Notes (Signed)
   Patient seen in PACU. Appears to be having pulmonary edema. Resting comfortably on BiPAP  Plan to place in stepdown unit overnight. Will follow closely.  Ricarda Frameharles Lanah Steines, MD [facility] General Surgeon [facility]  Day [facility] [phone](7a-7p) [phone] Night [facility] [phone](7p-7a) [phone]

## 2017-05-22 MED ORDER — FLUOXETINE HCL 20 MG PO CAPS
20.0000 mg | ORAL_CAPSULE | Freq: Every day | ORAL | Status: DC
  Administered 2017-05-22 – 2017-05-23 (×2): 20 mg via ORAL
  Filled 2017-05-22 (×2): qty 1

## 2017-05-22 MED ORDER — DIPHENHYDRAMINE HCL 25 MG PO CAPS
25.0000 mg | ORAL_CAPSULE | Freq: Every evening | ORAL | Status: DC | PRN
  Administered 2017-05-22 (×2): 25 mg via ORAL
  Filled 2017-05-22 (×3): qty 1

## 2017-05-22 MED ORDER — HYDROCODONE-ACETAMINOPHEN 5-325 MG PO TABS
1.0000 | ORAL_TABLET | Freq: Four times a day (QID) | ORAL | 0 refills | Status: AC | PRN
Start: 2017-05-22 — End: ?

## 2017-05-22 NOTE — Progress Notes (Signed)
Pt alert and oriented.  Resp distress has resolved.  All VSS.  Tolerating cl liq diet well.  Denies pain.  SBA OOB and ambulated 3x around nurses station.  Steady gait.  Delayed recovery of SaO2 once back to bed. No DOE during ambulation, but when returned to bed, SaO2 86-92% on 4L.  Recovery after 5 min and pt stabilized at 96% on 4L.  Pt compliant and participatory with all post-op teaching and activities.

## 2017-05-22 NOTE — Progress Notes (Signed)
1 Day Post-Op   Subjective: Patient did well overnight.  Required stepdown stay secondary to pulmonary edema.  Was able be weaned off of BiPAP to nasal cannula.  Has been ambulating the ICU.  States his abdominal pain is much improved from before surgery.  Tolerating a diet.  Vital signs in last 24 hours: Temp:  [97.4 F (36.3 C)-98.5 F (36.9 C)] (P) 98.5 F (36.9 C) (02/17 0800) Pulse Rate:  [54-113] 64 (02/17 0631) Resp:  [14-27] 18 (02/17 0631) BP: (93-139)/(43-84) (P) 122/53 (02/17 0800) SpO2:  [58 %-100 %] 100 % (02/17 0631) Weight:  [69.4 kg (152 lb 16 oz)] 69.4 kg (152 lb 16 oz) (02/16 1759) Last BM Date: 05/20/17  Intake/Output from previous day: 02/16 0701 - 02/17 0700 In: 1940 [P.O.:240; I.V.:1560; IV Piggyback:140] Out: 2205 [Urine:2200; Blood:5]  General: No acute distress Chest: Coarse breath sounds throughout but equal Heart: Regular rate and rhythm GI: Soft, nondistended, appropriately tender to palpation at the incision sites.  Dressings in place without any evidence of infection.  Lab Results:  CBC Recent Labs    05/21/17 0047  WBC 17.8*  HGB 15.3  HCT 46.2  PLT 268   CMP     Component Value Date/Time   NA 138 05/21/2017 0047   K 3.8 05/21/2017 0047   CL 105 05/21/2017 0047   CO2 20 (L) 05/21/2017 0047   GLUCOSE 143 (H) 05/21/2017 0047   BUN 12 05/21/2017 0047   CREATININE 0.67 05/21/2017 0047   CALCIUM 9.3 05/21/2017 0047   PROT 7.1 05/21/2017 0047   ALBUMIN 4.7 05/21/2017 0047   AST 35 05/21/2017 0047   ALT 21 05/21/2017 0047   ALKPHOS 88 05/21/2017 0047   BILITOT 1.1 05/21/2017 0047   GFRNONAA >60 05/21/2017 0047   GFRAA >60 05/21/2017 0047   PT/INR No results for input(s): LABPROT, INR in the last 72 hours.  Studies/Results: Dg Chest 1 View  Result Date: 05/21/2017 CLINICAL DATA:  Hypoxia, status post appendectomy. EXAM: CHEST 1 VIEW COMPARISON:  None. FINDINGS: Heart size is upper normal. Perihilar opacities bilaterally, compatible  with interstitial edema. Associated mild central pulmonary vascular congestion. Probable atelectasis at the left lung base. No pleural effusion or pneumothorax seen. Free intraperitoneal air underlying the diaphragm, expected status post today's appendectomy. Osseous structures about the chest are unremarkable. IMPRESSION: 1. Central pulmonary vascular congestion and mild bilateral interstitial edema suggesting mild volume overload. 2. Probable atelectasis at the left lung base. Alternatively aspiration. 3. Free intraperitoneal air underlying the diaphragm, an expected finding status post today's appendectomy. Electronically Signed   By: Bary RichardStan  Maynard M.D.   On: 05/21/2017 16:29   Ct Abdomen Pelvis W Contrast  Result Date: 05/21/2017 CLINICAL DATA:  Vomiting after dinner tonight. Lower abdominal pain. EXAM: CT ABDOMEN AND PELVIS WITH CONTRAST TECHNIQUE: Multidetector CT imaging of the abdomen and pelvis was performed using the standard protocol following bolus administration of intravenous contrast. CONTRAST:  100mL ISOVUE-300 IOPAMIDOL (ISOVUE-300) INJECTION 61% COMPARISON:  None. FINDINGS: LOWER CHEST: Trace tree-in-bud infiltrates lingula, 3 mm sub solid pulmonary nodule LEFT lower lobe, no routine indicated follow-up by consensus criteria. Included heart size is normal. No pericardial effusion. HEPATOBILIARY: Liver and gallbladder are normal. PANCREAS: Normal. SPLEEN: Normal. ADRENALS/URINARY TRACT: Kidneys are orthotopic, demonstrating symmetric enhancement. No nephrolithiasis, hydronephrosis or solid renal masses. The unopacified ureters are normal in course and caliber. Urinary bladder is partially distended with mild disproportionate bladder wall thickening. Normal adrenal glands. STOMACH/BOWEL: The stomach, small and large bowel are normal  in course and caliber without inflammatory changes, limited without oral contrast. Appendix: Location: RIGHT lower quadrant to  central pelvis. Diameter: 11 mm  Appendicolith: No Mucosal hyper-enhancement: Present with mural thickening. Extraluminal gas: Not present Periappendiceal collection: Not present VASCULAR/LYMPHATIC: Aortoiliac vessels are normal in course and caliber. No lymphadenopathy by CT size criteria. REPRODUCTIVE: Normal. OTHER: Small volume free fluid in the pelvis. No intraperitoneal free air or focal fluid collection. MUSCULOSKELETAL: Nonacute. IMPRESSION: 1. Acute uncomplicated appendicitis. 2. Mild bladder wall thickening concerning for cystitis. 3. Small volume free fluid, likely reactive. Acute findings discussed with and reconfirmed by Dr.ALLISON WEBSTER on 05/21/2017 at 5:40 am. Electronically Signed   By: Awilda Metro M.D.   On: 05/21/2017 05:43    Assessment/Plan: 22 year old male status post laparoscopic appendectomy.  Postoperative course comp gated by flash pulmonary edema.  Responded to treatment appropriately.  Plan to transfer out of the ICU this morning and wean off of oxygen as tolerated.  Likely stay an additional night secondary to his pulmonary findings.  Encourage ambulation, incentive spirometer usage, oral intake.  Plan to stop antibiotics at 24 hours.   Ricarda Frame, MD [facility] General Surgeon [facility]  [facility] [phone] Night [facility] [phone]  05/22/2017

## 2017-05-22 NOTE — Discharge Instructions (Signed)
Laparoscopic Appendectomy, Adult, Care After °These instructions give you information about caring for yourself after your procedure. Your doctor may also give you more specific instructions. Call your doctor if you have any problems or questions after your procedure. °Follow these instructions at home: °Medicines °· Take over-the-counter and prescription medicines only as told by your doctor. °· Do not drive for 24 hours if you received a sedative. °· Do not drive or use heavy machinery while taking prescription pain medicine. °· If you were prescribed an antibiotic medicine, take it as told by your doctor. Do not stop taking it even if you start to feel better. °Activity °· Do not lift anything that is heavier than 10 pounds (4.5 kg) for 3 weeks or as told by your doctor. °· Do not play contact sports for 3 weeks or as told by your doctor. °· Slowly return to your normal activities. °Bathing °· Keep your cuts from surgery (incisions) clean and dry. °? Gently wash the cuts with soap and water. °? Rinse the cuts with water until the soap is gone. °? Pat the cuts dry with a clean towel. Do not rub the cuts. °· You may take showers after 48 hours. °· Do not take baths, swim, or use a hot tub for 2 weeks or as told by your doctor. °Cut Care °· Follow instructions from your doctor about how to take care of your cuts. Make sure you: °? Wash your hands with soap and water before you change your bandage (dressing). If you do not have soap and water, use hand sanitizer. °? Change your bandage as told by your doctor. °? Leave stitches (sutures), skin glue, or skin tape (adhesive) strips in place. They may need to stay in place for 2 weeks or longer. If tape strips get loose and curl up, you may trim the loose edges. Do not remove tape strips completely unless your doctor says it is okay. °· Check your cuts every day for signs of infection. Check for: °? More redness, swelling, or pain. °? More fluid or  blood. °? Warmth. °? Pus or a bad smell. °Other Instructions °· If you were sent home with a drain, follow instructions from your doctor about how to use it and care for it. °· Take deep breaths. This helps to keep your lungs from getting swollen (inflamed). °· To help with constipation: °? Drink plenty of fluids. °? Eat plenty of fruits and vegetables. °· Keep all follow-up visits as told by your doctor. This is important. °Contact a doctor if: °· You have more redness, swelling, or pain around a cut from surgery. °· You have more fluid or blood coming from a cut. °· Your cut feels warm to the touch. °· You have pus or a bad smell coming from a cut or a bandage. °· The edges of a cut break open after the stitches have been taken out. °· You have pain in your shoulders that gets worse. °· You feel dizzy or you pass out (faint). °· You have shortness of breath. °· You keep feeling sick to your stomach (nauseous). °· You keep throwing up (vomiting). °· You get diarrhea or you cannot control your poop. °· You lose your appetite. °· You have swelling or pain in your legs. °Get help right away if: °· You have a fever. °· You get a rash. °· You have trouble breathing. °· You have sharp pains in your chest. °This information is not intended to replace advice given   to you by your health care provider. Make sure you discuss any questions you have with your health care provider. °Document Released: 01/16/2009 Document Revised: 08/28/2015 Document Reviewed: 09/09/2014 °Elsevier Interactive Patient Education © 2018 [employer] ° °

## 2017-05-22 NOTE — Anesthesia Postprocedure Evaluation (Signed)
Anesthesia Post Note  Patient: Letta Patezekiel Courter  Procedure(s) Performed: APPENDECTOMY LAPAROSCOPIC (N/A )  Patient location during evaluation: PACU Anesthesia Type: General Level of consciousness: awake and alert and oriented Pain management: pain level controlled Respiratory status: spontaneous breathing and patient connected to nasal cannula oxygen Cardiovascular status: blood pressure returned to baseline Comments: Patient had what appeared to be negative pressure pulmonary edemasome time during the early postoperative course.  Verified by chest film.  The patient responded to aggressive diuresis and Bipap.  He was weaned to room air the next day and appears well.     Last Vitals:  Vitals:   05/22/17 1255 05/22/17 1456  BP: 124/70 115/72  Pulse: 86 78  Resp: (!) 24 18  Temp: 36.9 C (!) 36.2 C  SpO2: 96% 95%    Last Pain:  Vitals:   05/22/17 1734  TempSrc:   PainSc: 1                  Jahari Billy

## 2017-05-22 NOTE — Plan of Care (Signed)
Pt slept well throughout the night after taking benadryl.  Maintaining SaO2>95% so Oxygen weaned down to [facility] w/ current SaO2 99%.  Tolerating general diet and denies pain.

## 2017-05-23 ENCOUNTER — Encounter: Payer: Self-pay | Admitting: General Surgery

## 2017-05-23 LAB — HIV ANTIBODY (ROUTINE TESTING W REFLEX): HIV Screen 4th Generation wRfx: NONREACTIVE

## 2017-05-23 LAB — GLUCOSE, CAPILLARY: Glucose-Capillary: 131 mg/dL — ABNORMAL HIGH (ref 65–99)

## 2017-05-23 NOTE — Discharge Summary (Signed)
Physician Discharge Summary  Patient ID: Bryan Roberson MRN: [id number] DOB/AGE: 08-29-1995 22 y.o.  Admit date: 05/21/2017 Discharge date: 05/23/2017   Discharge Diagnoses:  Active Problems:   Acute appendicitis   Appendicitis   Procedures: Laparoscopic appendectomy  Hospital Course: This patient was admitted to the hospital with signs of acute appendicitis.  He was taken the operating room appendectomy was performed to confirm the diagnosis.  In the PACU area and he was noted to have signs of flash pulmonary edema.  He was treated conservatively but monitored closely.  Today he is doing quite well with no breathing problems no shortness of breath no chest pain and he is tolerating a regular diet.  He is discharged in stable condition to follow-up with Dr. Tonita CongWoodham next week  Consults: None  Disposition: 01-Home or Self Care   Allergies as of 05/23/2017   No Known Allergies     Medication List    TAKE these medications   doxycycline 100 MG capsule Commonly known as:  VIBRAMYCIN Take 1 capsule by mouth daily.   FLUoxetine 20 MG capsule Commonly known as:  PROZAC Take 20 mg by mouth daily.   HYDROcodone-acetaminophen 5-325 MG tablet Commonly known as:  NORCO/VICODIN Take 1-2 tablets by mouth every 6 (six) hours as needed for moderate pain or severe pain.   hydrOXYzine 50 MG tablet Commonly known as:  ATARAX/VISTARIL Take 50 mg by mouth at bedtime as needed.   methylphenidate 30 MG 24 hr capsule Commonly known as:  RITALIN LA Take 1 capsule by mouth every morning.      Follow-up Information    Ricarda FrameWoodham, Charles, MD. Go in 5 day(s).   Specialty:  General Surgery Why:  Repor to clinic Friday Morning at 9:15am for a 9:30am appt with Dr. Tonita CongWoodham in the [place] office. Contact information: [address] [place] [address] [phone]           Lattie Hawichard E Brandan Glauber, MD, FACS

## 2017-05-23 NOTE — Progress Notes (Signed)
Pt discharged per MD order. Prescription given to pt. Discharge instructions reviewed with pt. Pt declined wheelchair and walked to car.

## 2017-05-23 NOTE — Progress Notes (Signed)
Patient feels well and is tolerating regular diet in fact he is eating pizza this afternoon.  Has no abdominal pain.  Wounds are clean and dressed.  Doing very well will recommend discharge today he will follow-up in our office per Dr. Andria RheinWoodrum's recommendations.

## 2017-05-23 NOTE — Progress Notes (Signed)
2 Days Post-Op  Subjective: This patient status post laparoscopic appendectomy who experienced flash pulmonary edema in the PACU.  He states he is having no shortness of breath no chest pain is breathing well without problems but is not feeling much like eating.  He is only taken clear liquids this morning.  Is passing gas and having no abdominal pain  Objective: Vital signs in last 24 hours: Temp:  [97.1 F (36.2 C)-98.5 F (36.9 C)] 97.8 F (36.6 C) (02/18 0445) Pulse Rate:  [56-86] 56 (02/18 0445) Resp:  [18-24] 22 (02/18 0445) BP: (108-125)/(52-76) 108/52 (02/18 0445) SpO2:  [93 %-98 %] 98 % (02/18 0445) Last BM Date: 05/20/17  Intake/Output from previous day: 02/17 0701 - 02/18 0700 In: 241 [P.O.:200; IV Piggyback:41] Out: 700 [Urine:700] Intake/Output this shift: No intake/output data recorded.  Physical exam:  Soft nontender abdomen wounds are clean no erythema no drainage chest is clear to auscultation no rales no rhonchi no respiratory distress  Lab Results: CBC  Recent Labs    05/21/17 0047  WBC 17.8*  HGB 15.3  HCT 46.2  PLT 268   BMET Recent Labs    05/21/17 0047  NA 138  K 3.8  CL 105  CO2 20*  GLUCOSE 143*  BUN 12  CREATININE 0.67  CALCIUM 9.3   PT/INR No results for input(s): LABPROT, INR in the last 72 hours. ABG No results for input(s): PHART, HCO3 in the last 72 hours.  Invalid input(s): PCO2, PO2  Studies/Results: Dg Chest 1 View  Result Date: 05/21/2017 CLINICAL DATA:  Hypoxia, status post appendectomy. EXAM: CHEST 1 VIEW COMPARISON:  None. FINDINGS: Heart size is upper normal. Perihilar opacities bilaterally, compatible with interstitial edema. Associated mild central pulmonary vascular congestion. Probable atelectasis at the left lung base. No pleural effusion or pneumothorax seen. Free intraperitoneal air underlying the diaphragm, expected status post today's appendectomy. Osseous structures about the chest are unremarkable.  IMPRESSION: 1. Central pulmonary vascular congestion and mild bilateral interstitial edema suggesting mild volume overload. 2. Probable atelectasis at the left lung base. Alternatively aspiration. 3. Free intraperitoneal air underlying the diaphragm, an expected finding status post today's appendectomy. Electronically Signed   By: Bary RichardStan  Maynard M.D.   On: 05/21/2017 16:29    Anti-infectives: Anti-infectives (From admission, onward)   Start     Dose/Rate Route Frequency Ordered Stop   05/21/17 0630  piperacillin-tazobactam (ZOSYN) IVPB 3.375 g     3.375 g 12.5 mL/hr over 240 Minutes Intravenous Every 8 hours 05/21/17 0616 05/22/17 1841   05/21/17 0600  cefTRIAXone (ROCEPHIN) 1 g in sodium chloride 0.9 % 100 mL IVPB  Status:  Discontinued     1 g 200 mL/hr over 30 Minutes Intravenous  Once 05/21/17 0554 05/21/17 [id number]      Assessment/Plan: s/p Procedure(s): APPENDECTOMY LAPAROSCOPIC   Patient is doing quite well at this point.  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  This was discussed with the patient and nursing  Lattie Hawichard E Nazaria Riesen, MD, FACS  05/23/2017

## 2017-05-24 LAB — SURGICAL PATHOLOGY

## 2017-05-27 ENCOUNTER — Telehealth: Payer: Self-pay

## 2017-05-27 ENCOUNTER — Encounter: Payer: Self-pay | Admitting: General Surgery

## 2017-05-27 NOTE — Telephone Encounter (Signed)
Call  Made to patient at this time to see if he would be coming in for his Post Op appointment. There was no answer and voicemail not available.   Patient called back and stated that he would not be able to make his appointment due to not having a ride. I verbalized understanding and rescheduled his appointment. Appointment was confirmed. Patient verbalized understanding.

## 2017-05-30 ENCOUNTER — Telehealth: Payer: Self-pay

## 2017-05-30 NOTE — Telephone Encounter (Signed)
CSW attempted to contact Mr. Bryan Roberson however, CSW was fowarded to voicemail and CSW left message for patient to return call. York SpanielMonica Sayre Witherington MSW,LCSW [phone]

## 2017-05-31 ENCOUNTER — Telehealth: Payer: Self-pay

## 2017-05-31 NOTE — Telephone Encounter (Signed)
CSW attempted to contact patient regarding [facility]. This was CSW's second attempt to contact patient. CSW left message. York SpanielMonica Aiyla Baucom MSW,LCSW [phone]

## 2017-06-03 ENCOUNTER — Encounter: Payer: Self-pay | Admitting: Surgery

## 2017-06-06 ENCOUNTER — Encounter: Payer: Self-pay | Admitting: General Surgery

## 2017-06-08 ENCOUNTER — Encounter: Payer: Self-pay | Admitting: General Surgery

## 2017-06-14 ENCOUNTER — Telehealth: Payer: Self-pay | Admitting: General Surgery

## 2017-06-14 NOTE — Telephone Encounter (Signed)
Left a message for the patient to call the office, patient no showed appointment on 06/08/17 with Dr. Tonita CongWoodham if patient calls back please reschedule no showed appointment.

## 2017-06-21 ENCOUNTER — Other Ambulatory Visit: Payer: Self-pay | Admitting: General Surgery

## 2017-06-21 ENCOUNTER — Telehealth: Payer: Self-pay | Admitting: General Surgery

## 2017-06-21 NOTE — Telephone Encounter (Signed)
When calling patient for a reminder call, patient was asking about an rx for his vicodin said it was called into the pharmacy but went to the pharmacy and was not there, but said he is still having some pain pain level being a five.please call patient and advise. ° ° °

## 2017-06-21 NOTE — Telephone Encounter (Addendum)
Called patient and asked how he was feeling. He stated that he was still having some abdominal pain and that he wanted more hydrocodone-acetaminophen. Patient denied having nausea, vomiting, fever, chills, diarrhea, and constipation. I told him that I had seen that we had scheduled him several times and he either cancels or no shows. 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 I then asked him when he would be going on Spring Break, he stated that it would be at the end of this week. So I then offered him an appointment to be seen on Monday 06/27/2017 and he stated that he was going to go out of town on Saturday 06/25/2017. I told him that I was trying to help him by getting him in the office to be seen for his Post-op and he stated that he has a tight schedule. Therefore, I told him that if his pain level was a five as described below, to go to the ED when he went home (PA). Patient stated that he was feeling better but if he needed to be seen then he would go to the ED. The only date that the patient agreed on coming in to be seen was until 07/04/2017. I told him to give us a call when he came back to school in case he needed to be seen sooner. Patient understood and had no further questions.

## 2017-06-21 NOTE — Telephone Encounter (Signed)
When calling patient for a reminder call, patient was asking about an rx for his vicodin said it was called into the pharmacy but went to the pharmacy and was not there, but said he is still having some pain pain level being a five.please call patient and advise.

## 2017-06-21 NOTE — Telephone Encounter (Signed)
Patients coming in on 07/04/17.

## 2017-07-04 ENCOUNTER — Encounter: Payer: Self-pay | Admitting: Surgery

## 2017-07-05 ENCOUNTER — Telehealth: Payer: Self-pay | Admitting: General Surgery

## 2017-07-05 NOTE — Telephone Encounter (Signed)
Have spoken to patient multiple times regarding appointments, patient keeps scheduling new appointments and then no showing or cancelling appointments. Patient had surgery back in February.

## 2018-09-07 IMAGING — DX DG CHEST 1V
1 series · 1 of 1 positions shown · non-contrast
Comparison: None.

CLINICAL DATA: Hypoxia, status post appendectomy.

EXAM:
CHEST 1 VIEW

[chest ap]
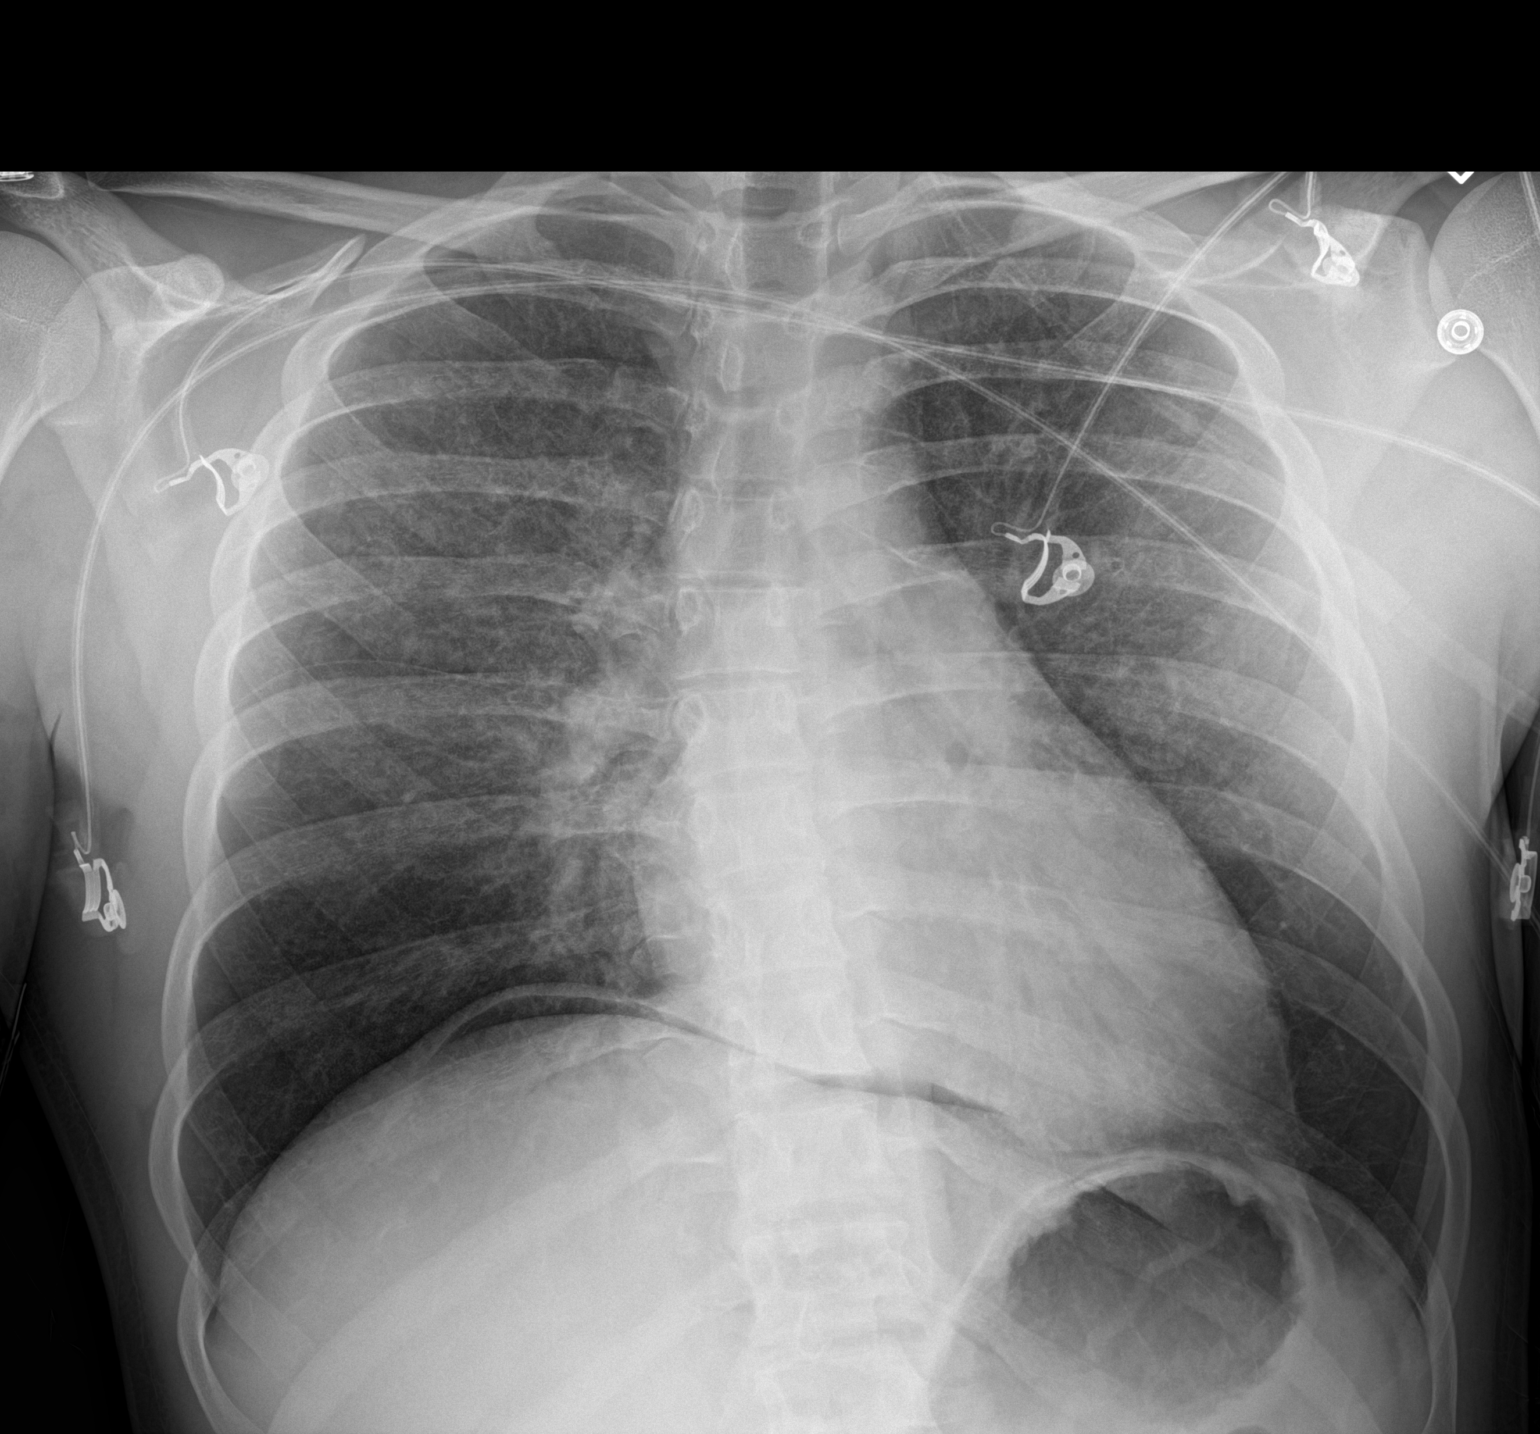

[1 of 1 positions shown; findings below may reference images not displayed]

FINDINGS: Heart size is upper normal. Perihilar opacities bilaterally,
compatible with interstitial edema. Associated mild central
pulmonary vascular congestion. Probable atelectasis at the left lung
base. No pleural effusion or pneumothorax seen.

Free intraperitoneal air underlying the diaphragm, expected status
post today's appendectomy. Osseous structures about the chest are
unremarkable.
IMPRESSION: 1. Central pulmonary vascular congestion and mild bilateral
interstitial edema suggesting mild volume overload.
2. Probable atelectasis at the left lung base. Alternatively
aspiration.
3. Free intraperitoneal air underlying the diaphragm, an expected
finding status post today's appendectomy.
